# Patient Record
Sex: Female | Born: 1960 | Hispanic: No | Smoking: Never smoker
Health system: Southern US, Community
[De-identification: ages and names within clinical notes are randomized; demographics above are authoritative.]

## PROBLEM LIST (undated history)

## (undated) DIAGNOSIS — F419 Anxiety disorder, unspecified: Secondary | ICD-10-CM

## (undated) DIAGNOSIS — K5792 Diverticulitis of intestine, part unspecified, without perforation or abscess without bleeding: Secondary | ICD-10-CM

## (undated) DIAGNOSIS — J309 Allergic rhinitis, unspecified: Secondary | ICD-10-CM

## (undated) DIAGNOSIS — K589 Irritable bowel syndrome without diarrhea: Secondary | ICD-10-CM

## (undated) DIAGNOSIS — N3010 Interstitial cystitis (chronic) without hematuria: Secondary | ICD-10-CM

## (undated) DIAGNOSIS — F410 Panic disorder [episodic paroxysmal anxiety] without agoraphobia: Secondary | ICD-10-CM

## (undated) HISTORY — PX: ABDOMINAL HYSTERECTOMY: SHX81

## (undated) HISTORY — PX: OTHER SURGICAL HISTORY: SHX169

## (undated) HISTORY — PX: FOOT SURGERY: SHX648

---

## 2011-12-10 ENCOUNTER — Ambulatory Visit: Payer: Self-pay

## 2015-03-21 ENCOUNTER — Ambulatory Visit
Admission: EM | Admit: 2015-03-21 | Discharge: 2015-03-21 | Disposition: A | Discharge status: Other Acute Inpatient Hospital | Payer: BLUE CROSS/BLUE SHIELD | Attending: Family Medicine | Admitting: Family Medicine

## 2015-03-21 ENCOUNTER — Encounter: Payer: Self-pay | Admitting: *Deleted

## 2015-03-21 DIAGNOSIS — R109 Unspecified abdominal pain: Secondary | ICD-10-CM

## 2015-03-21 HISTORY — DX: Anxiety disorder, unspecified: F41.9

## 2015-03-21 HISTORY — DX: Allergic rhinitis, unspecified: J30.9

## 2015-03-21 HISTORY — DX: Irritable bowel syndrome without diarrhea: K58.9

## 2015-03-21 HISTORY — DX: Interstitial cystitis (chronic) without hematuria: N30.10

## 2015-03-21 HISTORY — DX: Diverticulitis of intestine, part unspecified, without perforation or abscess without bleeding: K57.92

## 2015-03-21 HISTORY — DX: Panic disorder (episodic paroxysmal anxiety): F41.0

## 2015-03-21 NOTE — ED Provider Notes (Signed)
CSN: [id number]     Arrival date & time 03/21/15  1134 History   First MD Initiated Contact with Patient 03/21/15 1149     Chief Complaint  Patient presents with  . Abdominal Pain    Lower left quadrant   (Consider location/radiation/quality/duration/timing/severity/associated sxs/prior Treatment) HPI: Patient presents today with symptoms of left lower quadrant pain that started yesterday. Symptoms have worsened today. She also started to experience some left flank pain. She does admit to having some nausea but no vomiting or diarrhea. She states that she often is constipated. She did have a bowel movement earlier today she denies any bloody bowel movements. She does have a history of diverticulitis. She denies any chest pain, shortness of breath, severe headaches. She has experienced occasional dizziness. She does have a history of interstitial cystitis as well. She denies any history of kidney stones in the past.  Past Medical History  Diagnosis Date  . Diverticulitis   . Interstitial cystitis   . Allergic rhinitis   . IBS (irritable bowel syndrome)   . Anxiety   . Panic attacks    Past Surgical History  Procedure Laterality Date  . Abdominal hysterectomy    . Foot surgery Left   . Shoulder surgery Right    History reviewed. No pertinent family history. Social History  Substance Use Topics  . Smoking status: Never Smoker   . Smokeless tobacco: Never Used  . Alcohol Use: No   OB History    No data available     Review of Systems: Negative except mentioned above.   Allergies  Azithromycin; Biaxin; Nsaids; Penicillins; and Sulfa antibiotics  Home Medications   Prior to Admission medications   Medication Sig Start Date End Date Taking? Authorizing Provider  ALPRAZolam Prudy Feeler) 0.25 MG tablet Take 0.25 mg by mouth at bedtime as needed for anxiety.   Yes Historical Provider, MD  estradiol (EVAMIST) 1.53 MG/SPRAY transdermal spray Place 1 spray onto the skin daily.   Yes  Historical Provider, MD   Meds Ordered and Administered this Visit  Medications - No data to display  BP 121/46 mmHg  Pulse 81  Temp(Src) 98 F (36.7 C) (Oral)  Resp 22  Ht  (1.6 m)  Wt 145 lb (65.772 kg)  BMI 25.69 kg/m2  SpO2 100% No data found.   Physical Exam:  GENERAL: NAD HEENT: no pharyngeal erythema, no exudate, no erythema of TMs, no cervical LAD RESP: CTA B CARD: RRR  ABD: +BS, mild tenderness left lower quadrant, no rebound or guarding, mild left flank tenderness NEURO: CN II-XII grossly intact   ED Course  Procedures (including critical care time)    MDM   1. Left sided abdominal pain   2. Left flank pain    Recommend the patient go to the ER for further evaluation and treatment. Patient will likely have labs and also CT done. Patient is here with her daughter who will be going with her to the ER. His vitals are stable at this time to go by private vehicle.     Jolene Provost, MD 03/21/15 [phone]

## 2015-03-21 NOTE — ED Notes (Signed)
Patient started having lower left quadrant pain yesterday and the pain has worsened today. Additional symptoms include nausea, indigestion, and dizziness. Patient has had BM today and yesterday, but has been constipated for the past week. A history of diverticulitis is present.

## 2015-08-07 ENCOUNTER — Other Ambulatory Visit: Payer: Self-pay | Admitting: Family Medicine

## 2015-08-07 DIAGNOSIS — Z1231 Encounter for screening mammogram for malignant neoplasm of breast: Secondary | ICD-10-CM

## 2016-02-20 ENCOUNTER — Ambulatory Visit
Admission: RE | Admit: 2016-02-20 | Discharge: 2016-02-20 | Disposition: A | Discharge status: Home / Self Care | Payer: BLUE CROSS/BLUE SHIELD | Source: Ambulatory Visit | Attending: Family Medicine | Admitting: Family Medicine

## 2016-02-20 ENCOUNTER — Other Ambulatory Visit: Payer: Self-pay | Admitting: Family Medicine

## 2016-02-20 DIAGNOSIS — M549 Dorsalgia, unspecified: Secondary | ICD-10-CM

## 2016-02-20 DIAGNOSIS — M546 Pain in thoracic spine: Secondary | ICD-10-CM | POA: Diagnosis not present

## 2017-01-07 ENCOUNTER — Other Ambulatory Visit: Payer: Self-pay | Admitting: Family Medicine

## 2017-01-07 DIAGNOSIS — Z1231 Encounter for screening mammogram for malignant neoplasm of breast: Secondary | ICD-10-CM

## 2017-01-28 ENCOUNTER — Ambulatory Visit
Admission: RE | Admit: 2017-01-28 | Discharge: 2017-01-28 | Disposition: A | Discharge status: Home / Self Care | Payer: BLUE CROSS/BLUE SHIELD | Source: Ambulatory Visit | Attending: Family Medicine | Admitting: Family Medicine

## 2017-01-28 ENCOUNTER — Other Ambulatory Visit: Payer: Self-pay | Admitting: Family Medicine

## 2017-01-28 DIAGNOSIS — Z1231 Encounter for screening mammogram for malignant neoplasm of breast: Secondary | ICD-10-CM | POA: Diagnosis not present

## 2017-02-03 ENCOUNTER — Inpatient Hospital Stay
Admission: RE | Admit: 2017-02-03 | Discharge: 2017-02-03 | Disposition: A | Discharge status: Home / Self Care | Payer: Self-pay | Source: Ambulatory Visit | Attending: *Deleted | Admitting: *Deleted

## 2017-02-03 ENCOUNTER — Other Ambulatory Visit: Payer: Self-pay | Admitting: *Deleted

## 2017-02-03 DIAGNOSIS — Z9289 Personal history of other medical treatment: Secondary | ICD-10-CM

## 2018-07-27 IMAGING — CR DG CHEST 2V
2 series · 2 of 2 positions shown · non-contrast
Comparison: None.

CLINICAL DATA: Upper left back pain, mid epigastric pain for 1
week.

EXAM:
CHEST  2 VIEW

[chest pa]
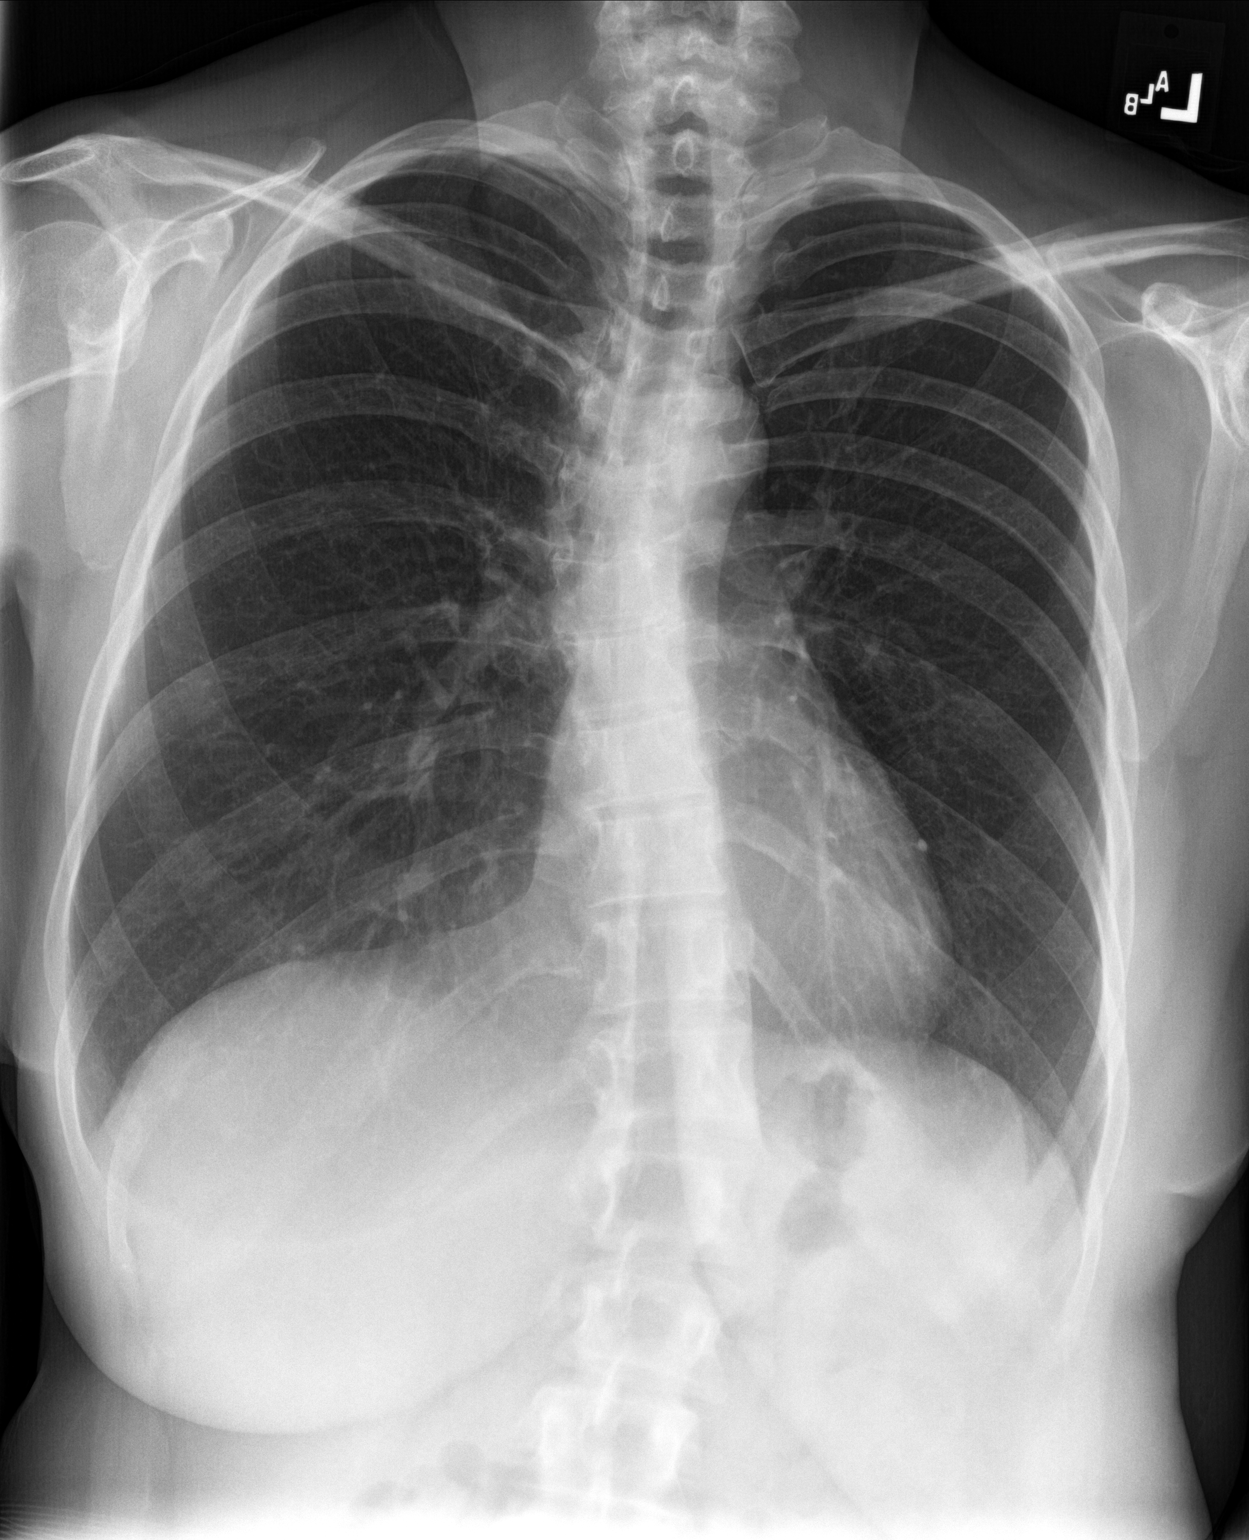

[chest lat]
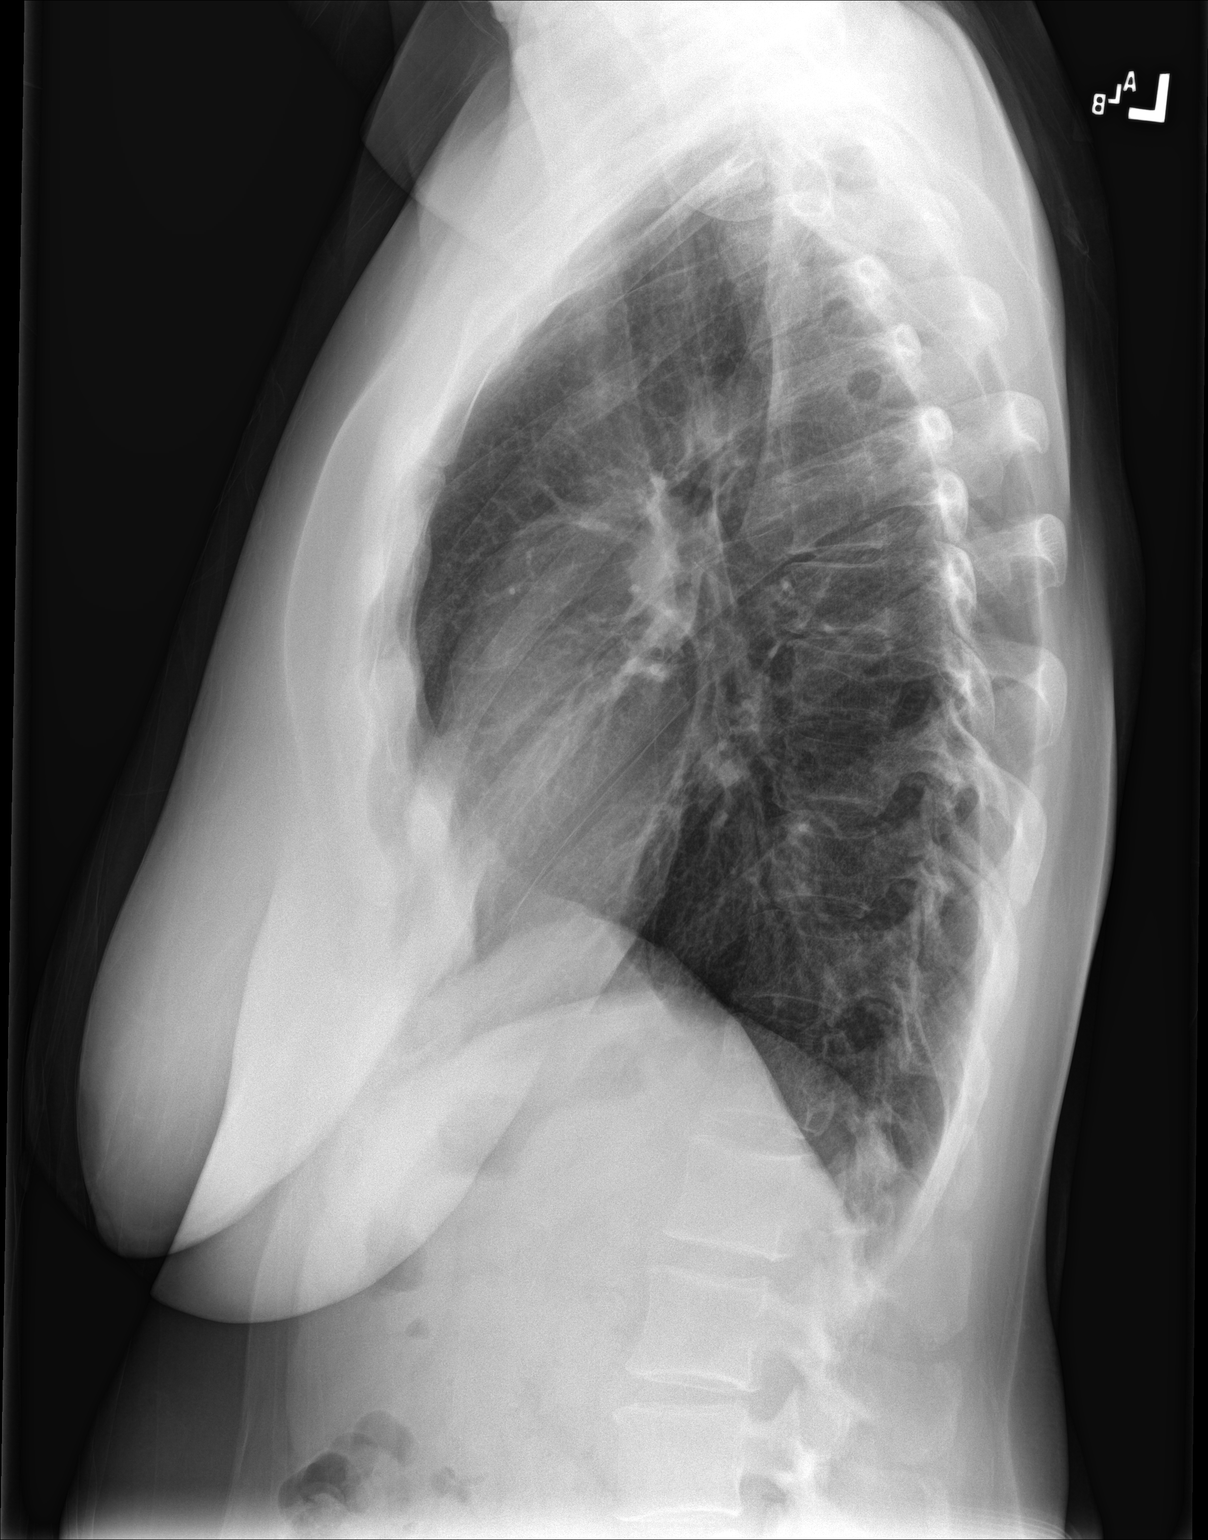

[2 of 2 positions shown; findings below may reference images not displayed]

FINDINGS: Heart and mediastinal contours are within normal limits. No focal
opacities or effusions. No acute bony abnormality. Mild S-shaped
thoracolumbar scoliosis.
IMPRESSION: No active cardiopulmonary disease.

## 2019-04-21 ENCOUNTER — Ambulatory Visit: Payer: Self-pay

## 2021-01-15 ENCOUNTER — Ambulatory Visit: Payer: Self-pay | Admitting: Physical Therapy
